# Patient Record
Sex: Female | Born: 1990 | Race: White | Hispanic: No | Smoking: Current every day smoker
Health system: Southern US, Community
[De-identification: ages and names within clinical notes are randomized; demographics above are authoritative.]

## PROBLEM LIST (undated history)

## (undated) DIAGNOSIS — O24419 Gestational diabetes mellitus in pregnancy, unspecified control: Secondary | ICD-10-CM

---

## 2017-06-12 ENCOUNTER — Encounter (HOSPITAL_COMMUNITY): Payer: Self-pay | Admitting: Emergency Medicine

## 2017-06-12 ENCOUNTER — Emergency Department (HOSPITAL_COMMUNITY)
Admission: EM | Admit: 2017-06-12 | Discharge: 2017-06-12 | Disposition: A | Discharge status: Home / Self Care | Payer: Medicaid Other | Attending: Emergency Medicine | Admitting: Emergency Medicine

## 2017-06-12 ENCOUNTER — Emergency Department (HOSPITAL_COMMUNITY): Payer: Medicaid Other

## 2017-06-12 DIAGNOSIS — O99331 Smoking (tobacco) complicating pregnancy, first trimester: Secondary | ICD-10-CM | POA: Diagnosis not present

## 2017-06-12 DIAGNOSIS — R05 Cough: Secondary | ICD-10-CM | POA: Insufficient documentation

## 2017-06-12 DIAGNOSIS — F17210 Nicotine dependence, cigarettes, uncomplicated: Secondary | ICD-10-CM | POA: Insufficient documentation

## 2017-06-12 DIAGNOSIS — O9952 Diseases of the respiratory system complicating childbirth: Secondary | ICD-10-CM | POA: Diagnosis not present

## 2017-06-12 DIAGNOSIS — R059 Cough, unspecified: Secondary | ICD-10-CM

## 2017-06-12 DIAGNOSIS — Z3A01 Less than 8 weeks gestation of pregnancy: Secondary | ICD-10-CM

## 2017-06-12 DIAGNOSIS — O219 Vomiting of pregnancy, unspecified: Secondary | ICD-10-CM | POA: Diagnosis not present

## 2017-06-12 DIAGNOSIS — R0602 Shortness of breath: Secondary | ICD-10-CM | POA: Insufficient documentation

## 2017-06-12 DIAGNOSIS — J40 Bronchitis, not specified as acute or chronic: Secondary | ICD-10-CM | POA: Insufficient documentation

## 2017-06-12 DIAGNOSIS — O9989 Other specified diseases and conditions complicating pregnancy, childbirth and the puerperium: Secondary | ICD-10-CM | POA: Diagnosis present

## 2017-06-12 LAB — CBC
HCT: 40.2 % (ref 36.0–46.0)
Hemoglobin: 13.2 g/dL (ref 12.0–15.0)
MCH: 28.5 pg (ref 26.0–34.0)
MCHC: 32.8 g/dL (ref 30.0–36.0)
MCV: 86.8 fL (ref 78.0–100.0)
PLATELETS: 305 10*3/uL (ref 150–400)
RBC: 4.63 MIL/uL (ref 3.87–5.11)
RDW: 14.8 % (ref 11.5–15.5)
WBC: 18.3 10*3/uL — AB (ref 4.0–10.5)

## 2017-06-12 LAB — BASIC METABOLIC PANEL
Anion gap: 6 (ref 5–15)
BUN: 11 mg/dL (ref 6–20)
CALCIUM: 8.8 mg/dL — AB (ref 8.9–10.3)
CO2: 22 mmol/L (ref 22–32)
Chloride: 107 mmol/L (ref 101–111)
Creatinine, Ser: 0.56 mg/dL (ref 0.44–1.00)
GFR calc Af Amer: >60 mL/min (ref >60)
GLUCOSE: 118 mg/dL — AB (ref 65–99)
Potassium: 3.6 mmol/L (ref 3.5–5.1)
SODIUM: 135 mmol/L (ref 135–145)

## 2017-06-12 LAB — I-STAT BETA HCG BLOOD, ED (MC, WL, AP ONLY): HCG, QUANTITATIVE: 274.6 m[IU]/mL — AB (ref <5)

## 2017-06-12 MED ORDER — HYDROCOD POLST-CPM POLST ER 10-8 MG/5ML PO SUER: 5.0000 mL | Freq: Two times a day (BID) | ORAL | 0 refills | Status: DC | PRN

## 2017-06-12 MED ORDER — ALBUTEROL SULFATE (2.5 MG/3ML) 0.083% IN NEBU
5.0000 mg | INHALATION_SOLUTION | Freq: Once | RESPIRATORY_TRACT | Status: AC
  Administered 2017-06-12: 5 mg via RESPIRATORY_TRACT
  Filled 2017-06-12: qty 6

## 2017-06-12 MED ORDER — IPRATROPIUM BROMIDE 0.02 % IN SOLN
0.5000 mg | Freq: Once | RESPIRATORY_TRACT | Status: AC
  Administered 2017-06-12: 0.5 mg via RESPIRATORY_TRACT
  Filled 2017-06-12: qty 2.5

## 2017-06-12 MED ORDER — ALBUTEROL SULFATE (2.5 MG/3ML) 0.083% IN NEBU
INHALATION_SOLUTION | RESPIRATORY_TRACT | Status: AC
  Filled 2017-06-12: qty 6

## 2017-06-12 MED ORDER — ALBUTEROL SULFATE (2.5 MG/3ML) 0.083% IN NEBU
5.0000 mg | INHALATION_SOLUTION | Freq: Once | RESPIRATORY_TRACT | Status: AC
  Administered 2017-06-12: 5 mg via RESPIRATORY_TRACT

## 2017-06-12 NOTE — Discharge Instructions (Signed)
Stop taking Hycodan cough elixir and start Tussionex as directed. Continue your other medications. Follow up with your doctor for recheck in 2 days. Return here with any worsening symptoms or new concerns.

## 2017-06-12 NOTE — ED Provider Notes (Signed)
[facility] DEPT Provider Note   CSN: [id number] Arrival date & time: 06/12/17  0008     History   Chief Complaint Chief Complaint  Patient presents with  . Cough  . Shortness of Breath    HPI Jenna Silva is a 26 y.o. female.  Patient with cough, chills without fever, post-tussive vomiting and chest discomfort for the past week. She was seen at [facility] ED 3 days ago (06/09/17) and diagnosed with bronchitis. She has been taking steroids, zithromax, hycodan, and an albuterol inhaler since she was seen there but feels she is getting worse, prompting a return to the emergency department. She reports positive pregnancy tests at home. No vaginal bleeding, discharge.    The history is provided by the patient. No language interpreter was used.  Cough  Associated symptoms include chills and shortness of breath. Pertinent negatives include no chest pain, no headaches, no rhinorrhea, no sore throat and no myalgias.  Shortness of Breath  Associated symptoms include cough and vomiting. Pertinent negatives include no fever, no headaches, no rhinorrhea, no sore throat, no chest pain and no abdominal pain.    History reviewed. No pertinent past medical history.  There are no active problems to display for this patient.   Past Surgical History:  Procedure Laterality Date  . CESAREAN SECTION  2011    OB History    No data available       Home Medications    Prior to Admission medications   Not on File    Family History No family history on file.  Social History Social History  Substance Use Topics  . Smoking status: Current Every Day Smoker    Packs/day: 0.50    Types: Cigarettes  . Smokeless tobacco: Never Used  . Alcohol use No     Allergies   Patient has no known allergies.   Review of Systems Review of Systems  Constitutional: Positive for chills. Negative for fever.  HENT: Negative.  Negative for congestion, rhinorrhea, sinus pressure and  sore throat.   Respiratory: Positive for cough, chest tightness and shortness of breath.   Cardiovascular: Negative.  Negative for chest pain.  Gastrointestinal: Positive for vomiting. Negative for abdominal pain and nausea.  Musculoskeletal: Negative.  Negative for myalgias.  Skin: Negative.   Neurological: Negative.  Negative for weakness, light-headedness and headaches.     Physical Exam Updated Vital Signs BP 129/75 (BP Location: Right Arm)   Pulse 96   Temp 98.1 F (36.7 C) (Oral)   Resp (!) 22   LMP 05/14/2017 (Exact Date)   SpO2 94%   Physical Exam  Constitutional: She is oriented to person, place, and time. She appears well-developed and well-nourished. No distress.  HENT:  Head: Normocephalic.  Mouth/Throat: Oropharynx is clear and moist.  Neck: Normal range of motion. Neck supple.  Cardiovascular: Normal rate and regular rhythm.   No murmur heard. Pulmonary/Chest: Effort normal. She has no wheezes.  Actively coughing. Full air movement to all fields.  Abdominal: Soft. Bowel sounds are normal. There is no tenderness. There is no rebound and no guarding.  Musculoskeletal: Normal range of motion. She exhibits no edema.  Neurological: She is alert and oriented to person, place, and time.  Skin: Skin is warm and dry. No rash noted.  Psychiatric: She has a normal mood and affect.     ED Treatments / Results  Labs (all labs ordered are listed, but only abnormal results are displayed) Labs Reviewed  CBC - Abnormal;  Notable for the following:       Result Value   WBC 18.3 (*)    All other components within normal limits  BASIC METABOLIC PANEL - Abnormal; Notable for the following:    Glucose, Bld 118 (*)    Calcium 8.8 (*)    All other components within normal limits  I-STAT BETA HCG BLOOD, ED ([facility], [facility], [facility] ONLY) - Abnormal; Notable for the following:    I-stat hCG, quantitative 274.6 (*)    All other components within normal limits   Results for orders placed or  performed during the hospital encounter of 06/12/17  CBC  Result Value Ref Range   WBC 18.3 (H) 4.0 - 10.5 K/uL   RBC 4.63 3.87 - 5.11 MIL/uL   Hemoglobin 13.2 12.0 - 15.0 g/dL   HCT 16.1 09.6 - 04.5 %   MCV 86.8 78.0 - 100.0 fL   MCH 28.5 26.0 - 34.0 pg   MCHC 32.8 30.0 - 36.0 g/dL   RDW 40.9 81.1 - 91.4 %   Platelets 305 150 - 400 K/uL  Basic metabolic panel  Result Value Ref Range   Sodium 135 135 - 145 mmol/L   Potassium 3.6 3.5 - 5.1 mmol/L   Chloride 107 101 - 111 mmol/L   CO2 22 22 - 32 mmol/L   Glucose, Bld 118 (H) 65 - 99 mg/dL   BUN 11 6 - 20 mg/dL   Creatinine, Ser 7.82 0.44 - 1.00 mg/dL   Calcium 8.8 (L) 8.9 - 10.3 mg/dL   GFR calc non Af Amer >60 >60 mL/min   GFR calc Af Amer >60 >60 mL/min   Anion gap 6 5 - 15  I-Stat Beta hCG blood, ED ([facility], [facility], [facility] only)  Result Value Ref Range   I-stat hCG, quantitative 274.6 (H) <5 mIU/mL   Comment 3            EKG  EKG Interpretation None       Radiology Dg Chest 2 View  Result Date: 06/12/2017 CLINICAL DATA:  Shortness of breath and wheezing 2 days with cough. EXAM: CHEST  2 VIEW COMPARISON:  01/18/2015 FINDINGS: Lungs are adequately inflated without focal consolidation or effusion. There is minimal prominence of the central bronchovascular markings. Cardiomediastinal silhouette and remainder the exam is unchanged. IMPRESSION: Minimal prominence of the central bronchovascular markings which may be due to an acute bronchitic process. Electronically Signed   By: Elberta Fortis M.D.   On: 06/12/2017 01:18    Procedures Procedures (including critical care time)  Medications Ordered in ED Medications  albuterol (PROVENTIL) (2.5 MG/3ML) 0.083% nebulizer solution (not administered)  albuterol (PROVENTIL) (2.5 MG/3ML) 0.083% nebulizer solution 5 mg (5 mg Nebulization Given 06/12/17 0033)     Initial Impression / Assessment and Plan / ED Course  I have reviewed the triage vital signs and the nursing notes.  Pertinent  labs & imaging results that were available during my care of the patient were reviewed by me and considered in my medical decision making (see chart for details).     Patient presents for evaluation of cough. She is being treated for bronchitis with steroids, zithromax, hycodan and albuterol and reports her cough is getting worse. No fever. She denies any tobacco use in 3 days.   CXR clear. She is actively coughing throughout exam. Nebulizer provided with some relief. Nonproductive cough. No wheezing on exam. Full air movement.   Discussed cough control with NP at [facility]. Patient is very  early pregnancy. Advised Tussionex can be safely used. Will provide Rx tussionex. Recommended PCP follow up this week.   Final Clinical Impressions(s) / ED Diagnoses   Final diagnoses:  None   1. Bronchitis 2. Cough  New Prescriptions New Prescriptions   No medications on file     Elpidio Anis, Cordelia Poche 06/12/17 [id number]    Glynn Octave, MD 06/12/17 [phone]

## 2017-06-12 NOTE — ED Triage Notes (Signed)
Pt reports wheezing/SHOB since Friday, seen at [facility] and dxed with bronchitis. States taking zpack, methylprednisolone, albuterol inhaler and hydrocodone cough syrup, states still not feeling better. Also reporting three positive pregnancy tests this week.

## 2017-06-12 NOTE — ED Notes (Signed)
PT states understanding of care given, follow up care, and medication prescribed. PT ambulated from ED to car with a steady gait. 

## 2017-06-25 ENCOUNTER — Encounter (HOSPITAL_COMMUNITY): Payer: Self-pay | Admitting: Emergency Medicine

## 2017-06-25 DIAGNOSIS — Z3A01 Less than 8 weeks gestation of pregnancy: Secondary | ICD-10-CM | POA: Diagnosis not present

## 2017-06-25 DIAGNOSIS — O9989 Other specified diseases and conditions complicating pregnancy, childbirth and the puerperium: Secondary | ICD-10-CM | POA: Diagnosis not present

## 2017-06-25 DIAGNOSIS — O99331 Smoking (tobacco) complicating pregnancy, first trimester: Secondary | ICD-10-CM | POA: Insufficient documentation

## 2017-06-25 DIAGNOSIS — R102 Pelvic and perineal pain: Secondary | ICD-10-CM | POA: Insufficient documentation

## 2017-06-25 DIAGNOSIS — F17210 Nicotine dependence, cigarettes, uncomplicated: Secondary | ICD-10-CM | POA: Diagnosis not present

## 2017-06-25 DIAGNOSIS — R1032 Left lower quadrant pain: Secondary | ICD-10-CM | POA: Diagnosis not present

## 2017-06-25 DIAGNOSIS — R112 Nausea with vomiting, unspecified: Secondary | ICD-10-CM | POA: Insufficient documentation

## 2017-06-25 LAB — COMPREHENSIVE METABOLIC PANEL
ALBUMIN: 3.4 g/dL — AB (ref 3.5–5.0)
ALT: 27 U/L (ref 14–54)
AST: 19 U/L (ref 15–41)
Alkaline Phosphatase: 71 U/L (ref 38–126)
Anion gap: 8 (ref 5–15)
BUN: 8 mg/dL (ref 6–20)
CHLORIDE: 104 mmol/L (ref 101–111)
CO2: 19 mmol/L — ABNORMAL LOW (ref 22–32)
Calcium: 8.7 mg/dL — ABNORMAL LOW (ref 8.9–10.3)
Creatinine, Ser: 0.64 mg/dL (ref 0.44–1.00)
GFR calc Af Amer: >60 mL/min (ref >60)
GFR calc non Af Amer: >60 mL/min (ref >60)
GLUCOSE: 117 mg/dL — AB (ref 65–99)
POTASSIUM: 3.7 mmol/L (ref 3.5–5.1)
SODIUM: 131 mmol/L — AB (ref 135–145)
Total Bilirubin: 0.5 mg/dL (ref 0.3–1.2)
Total Protein: 6.3 g/dL — ABNORMAL LOW (ref 6.5–8.1)

## 2017-06-25 LAB — URINALYSIS, ROUTINE W REFLEX MICROSCOPIC
Bilirubin Urine: NEGATIVE
GLUCOSE, UA: NEGATIVE mg/dL
Hgb urine dipstick: NEGATIVE
KETONES UR: NEGATIVE mg/dL
LEUKOCYTES UA: NEGATIVE
Nitrite: NEGATIVE
PH: 5 (ref 5.0–8.0)
Protein, ur: NEGATIVE mg/dL
Specific Gravity, Urine: 1.025 (ref 1.005–1.030)

## 2017-06-25 LAB — CBC
HEMATOCRIT: 39.9 % (ref 36.0–46.0)
Hemoglobin: 13.3 g/dL (ref 12.0–15.0)
MCH: 28.7 pg (ref 26.0–34.0)
MCHC: 33.3 g/dL (ref 30.0–36.0)
MCV: 86 fL (ref 78.0–100.0)
Platelets: 260 10*3/uL (ref 150–400)
RBC: 4.64 MIL/uL (ref 3.87–5.11)
RDW: 14.9 % (ref 11.5–15.5)
WBC: 14.1 10*3/uL — AB (ref 4.0–10.5)

## 2017-06-25 LAB — LIPASE, BLOOD: LIPASE: 30 U/L (ref 11–51)

## 2017-06-25 LAB — HCG, QUANTITATIVE, PREGNANCY: hCG, Beta Chain, Quant, S: 9630 m[IU]/mL — ABNORMAL HIGH (ref <5)

## 2017-06-25 NOTE — ED Triage Notes (Signed)
Pt reports abdominal cramping and some dizziness for the past week. Pt reports pain ususally decreases w/ bowel movement however not for he past three days even though she has had regular bowel movements.  Pt is [redacted] weeks pregnant.

## 2017-06-26 ENCOUNTER — Emergency Department (HOSPITAL_COMMUNITY): Payer: Medicaid Other

## 2017-06-26 ENCOUNTER — Emergency Department (HOSPITAL_COMMUNITY)
Admission: EM | Admit: 2017-06-26 | Discharge: 2017-06-26 | Disposition: A | Discharge status: Home / Self Care | Payer: Medicaid Other | Attending: Emergency Medicine | Admitting: Emergency Medicine

## 2017-06-26 DIAGNOSIS — R109 Unspecified abdominal pain: Secondary | ICD-10-CM

## 2017-06-26 DIAGNOSIS — O219 Vomiting of pregnancy, unspecified: Secondary | ICD-10-CM

## 2017-06-26 DIAGNOSIS — O26891 Other specified pregnancy related conditions, first trimester: Secondary | ICD-10-CM

## 2017-06-26 LAB — WET PREP, GENITAL
Sperm: NONE SEEN
Trich, Wet Prep: NONE SEEN
Yeast Wet Prep HPF POC: NONE SEEN

## 2017-06-26 LAB — ABO/RH: ABO/RH(D): O POS

## 2017-06-26 LAB — GC/CHLAMYDIA PROBE AMP (~~LOC~~) NOT AT ARMC
Chlamydia: NEGATIVE
NEISSERIA GONORRHEA: NEGATIVE

## 2017-06-26 MED ORDER — DOXYLAMINE-PYRIDOXINE 10-10 MG PO TBEC: 2.0000 | DELAYED_RELEASE_TABLET | Freq: Every day | ORAL | 2 refills | Status: AC

## 2017-06-26 NOTE — ED Notes (Signed)
Pt departed in NAD, refused use of wheelchair.  

## 2017-06-26 NOTE — ED Provider Notes (Signed)
[facility] Provider Note   CSN: [id number] Arrival date & time: 06/25/17  2209     History   Chief Complaint Chief Complaint  Patient presents with  . Abdominal Cramping  . Dizziness    HPI Jenna Silva is a 26 y.o. female.  Patient presents to the emergency department for evaluation of abdominal and pelvic pain. Patient reports that she has been having symptoms for approximately a week. She reports intermittent left sided stabbing pains. Pains are predominantly in the left upper abdomen area, but at times her left lower abdomen and radiate up to the upper abdomen. She has had nausea and vomiting associated with early pregnancy, no change in these symptoms. She has not had diarrhea or constipation.      History reviewed. No pertinent past medical history.  There are no active problems to display for this patient.   Past Surgical History:  Procedure Laterality Date  . CESAREAN SECTION  2011    OB History    No data available       Home Medications    Prior to Admission medications   Medication Sig Start Date End Date Taking? Authorizing Provider  Prenatal Vit-Fe Fumarate-FA (PRENATAL MULTIVITAMIN) TABS tablet Take 1 tablet by mouth daily at 12 noon.   Yes [provider]  Doxylamine-Pyridoxine 10-10 MG TBEC Take 2 tablets by mouth at bedtime. 06/26/17   Gilda Crease, MD    Family History No family history on file.  Social History Social History  Substance Use Topics  . Smoking status: Current Every Day Smoker    Packs/day: 0.50    Types: Cigarettes  . Smokeless tobacco: Never Used  . Alcohol use No     Allergies   Patient has no known allergies.   Review of Systems Review of Systems  Gastrointestinal: Positive for abdominal pain.  Genitourinary: Positive for pelvic pain and vaginal discharge (white and creamy). Negative for vaginal bleeding.  All other systems reviewed and are negative.    Physical  Exam Updated Vital Signs BP 111/62   Pulse 98   Temp 97.8 F (36.6 C) (Oral)   Resp 18   Ht  (1.626 m)   Wt 117.9 kg (260 lb)   SpO2 99%   BMI 44.63 kg/m   Physical Exam  Constitutional: She is oriented to person, place, and time. She appears well-developed and well-nourished. No distress.  HENT:  Head: Normocephalic and atraumatic.  Right Ear: Hearing normal.  Left Ear: Hearing normal.  Nose: Nose normal.  Mouth/Throat: Oropharynx is clear and moist and mucous membranes are normal.  Eyes: Pupils are equal, round, and reactive to light. Conjunctivae and EOM are normal.  Neck: Normal range of motion. Neck supple.  Cardiovascular: Regular rhythm, S1 normal and S2 normal.  Exam reveals no gallop and no friction rub.   No murmur heard. Pulmonary/Chest: Effort normal and breath sounds normal. No respiratory distress. She exhibits no tenderness.  Abdominal: Soft. Normal appearance and bowel sounds are normal. There is no hepatosplenomegaly. There is tenderness in the left upper quadrant and left lower quadrant. There is no rebound, no guarding, no tenderness at McBurney's point and negative Murphy's sign. No hernia.  Musculoskeletal: Normal range of motion.  Neurological: She is alert and oriented to person, place, and time. She has normal strength. No cranial nerve deficit or sensory deficit. Coordination normal. GCS eye subscore is 4. GCS verbal subscore is 5. GCS motor subscore is 6.  Skin: Skin is warm,  dry and intact. No rash noted. No cyanosis.  Psychiatric: She has a normal mood and affect. Her speech is normal and behavior is normal. Thought content normal.  Nursing note and vitals reviewed.    ED Treatments / Results  Labs (all labs ordered are listed, but only abnormal results are displayed) Labs Reviewed  COMPREHENSIVE METABOLIC PANEL - Abnormal; Notable for the following:       Result Value   Sodium 131 (*)    CO2 19 (*)    Glucose, Bld 117 (*)    Calcium 8.7  (*)    Total Protein 6.3 (*)    Albumin 3.4 (*)    All other components within normal limits  CBC - Abnormal; Notable for the following:    WBC 14.1 (*)    All other components within normal limits  URINALYSIS, ROUTINE W REFLEX MICROSCOPIC - Abnormal; Notable for the following:    APPearance HAZY (*)    All other components within normal limits  HCG, QUANTITATIVE, PREGNANCY - Abnormal; Notable for the following:    hCG, Beta Chain, Quant, S 9,630 (*)    All other components within normal limits  WET PREP, GENITAL  LIPASE, BLOOD  ABO/RH  GC/CHLAMYDIA PROBE AMP ([place]) NOT AT [facility]    EKG  EKG Interpretation None       Radiology US Ob Comp Less 14 Wks  Result Date: 06/26/2017 CLINICAL DATA:  Initial evaluation for acute pelvic pain and early pregnancy EXAM: OBSTETRIC <14 WK [place] AND TRANSVAGINAL OB US TECHNIQUE: Both transabdominal and transvaginal ultrasound examinations were performed for complete evaluation of the gestation as well as the maternal uterus, adnexal regions, and pelvic cul-de-sac. Transvaginal technique was performed to assess early pregnancy. COMPARISON:  None. FINDINGS: Intrauterine gestational sac: Single Yolk sac:  Present Embryo:  Not visualized. Cardiac Activity: N/A Heart Rate: N/A  bpm MSD: 11.2  mm   5 w   6  d Subchorionic hemorrhage:  None visualized. Maternal uterus/adnexae: Ovaries normal in appearance bilaterally. No adnexal mass. Trace free fluid within the pelvis. IMPRESSION: 1. Single intrauterine gestational sac with internal yolk sac, but no embryo yet visualized. Estimated gestational age 372 weeks and 6 days by mean sac diameter. Recommend follow-up quantitative B-HCG levels and follow-up US in 14 days to assess viability. This recommendation follows SRU consensus guidelines: Diagnostic Criteria for Nonviable Pregnancy Early in the First Trimester. Malva Limes Med 2013; 782:9562-13. 2. No other acute maternal uterine or adnexal abnormality identified.  Electronically Signed   By: Rise Mu M.D.   On: 06/26/2017 01:56   US Ob Transvaginal  Result Date: 06/26/2017 CLINICAL DATA:  Initial evaluation for acute pelvic pain and early pregnancy EXAM: OBSTETRIC <14 WK [place] AND TRANSVAGINAL OB US TECHNIQUE: Both transabdominal and transvaginal ultrasound examinations were performed for complete evaluation of the gestation as well as the maternal uterus, adnexal regions, and pelvic cul-de-sac. Transvaginal technique was performed to assess early pregnancy. COMPARISON:  None. FINDINGS: Intrauterine gestational sac: Single Yolk sac:  Present Embryo:  Not visualized. Cardiac Activity: N/A Heart Rate: N/A  bpm MSD: 11.2  mm   5 w   6  d Subchorionic hemorrhage:  None visualized. Maternal uterus/adnexae: Ovaries normal in appearance bilaterally. No adnexal mass. Trace free fluid within the pelvis. IMPRESSION: 1. Single intrauterine gestational sac with internal yolk sac, but no embryo yet visualized. Estimated gestational age 372 weeks and 6 days by mean sac diameter. Recommend follow-up quantitative B-HCG levels and follow-up  US in 14 days to assess viability. This recommendation follows SRU consensus guidelines: Diagnostic Criteria for Nonviable Pregnancy Early in the First Trimester. Malva Limes Med 2013; 010:2725-36. 2. No other acute maternal uterine or adnexal abnormality identified. Electronically Signed   By: Rise Mu M.D.   On: 06/26/2017 01:56    Procedures Procedures (including critical care time)  Medications Ordered in ED Medications - No data to display   Initial Impression / Assessment and Plan / ED Course  I have reviewed the triage vital signs and the nursing notes.  Pertinent labs & imaging results that were available during my care of the patient were reviewed by me and considered in my medical decision making (see chart for details).     Patient presents to the emergency department with complaints of left-sided abdominal  and pelvic pain. Symptoms have been ongoing for a week. She reports that she thinks she might have pulled a muscle, but is concerned because she is a proximally [redacted] weeks pregnant and has had a miscarriage in the past. She has not had any vaginal bleeding. Exam revealed predominantly left upper quadrant tenderness, did have left lower tenderness as well. No guarding or rebound. Blood work unremarkable other than leukocytosis, unclear significance. No right-sided tenderness to suggest gallbladder disease or appendicitis.Ultrasound shows intrauterine gestational sac 5 weeks 6 days, no embryo noted yet. This was conveyed to the patient, she has follow-up scheduled with OB/GYN in 2 weeks which is appropriate. Treat pain with Tylenol. Diclegis for n/v.  Final Clinical Impressions(s) / ED Diagnoses   Final diagnoses:  Abdominal pain during pregnancy in first trimester  Nausea and vomiting in pregnancy    New Prescriptions New Prescriptions   DOXYLAMINE-PYRIDOXINE 10-10 MG TBEC    Take 2 tablets by mouth at bedtime.     Gilda Crease, MD 06/26/17 [phone]

## 2018-05-04 ENCOUNTER — Other Ambulatory Visit (HOSPITAL_COMMUNITY): Payer: Self-pay | Admitting: Specialist

## 2018-05-04 DIAGNOSIS — Z363 Encounter for antenatal screening for malformations: Secondary | ICD-10-CM

## 2018-05-04 DIAGNOSIS — Z3A33 33 weeks gestation of pregnancy: Secondary | ICD-10-CM

## 2018-05-04 DIAGNOSIS — O365931 Maternal care for other known or suspected poor fetal growth, third trimester, fetus 1: Secondary | ICD-10-CM

## 2018-05-08 ENCOUNTER — Other Ambulatory Visit (HOSPITAL_COMMUNITY): Payer: Self-pay | Admitting: Specialist

## 2018-05-08 ENCOUNTER — Ambulatory Visit (HOSPITAL_COMMUNITY)
Admission: RE | Admit: 2018-05-08 | Discharge: 2018-05-08 | Disposition: A | Discharge status: Home / Self Care | Payer: Medicaid Other | Source: Ambulatory Visit | Attending: Specialist | Admitting: Specialist

## 2018-05-08 ENCOUNTER — Encounter (HOSPITAL_COMMUNITY): Payer: Self-pay | Admitting: *Deleted

## 2018-05-08 DIAGNOSIS — Z363 Encounter for antenatal screening for malformations: Secondary | ICD-10-CM

## 2018-05-08 DIAGNOSIS — O24414 Gestational diabetes mellitus in pregnancy, insulin controlled: Secondary | ICD-10-CM | POA: Insufficient documentation

## 2018-05-08 DIAGNOSIS — O34219 Maternal care for unspecified type scar from previous cesarean delivery: Secondary | ICD-10-CM | POA: Insufficient documentation

## 2018-05-08 DIAGNOSIS — F17200 Nicotine dependence, unspecified, uncomplicated: Secondary | ICD-10-CM | POA: Diagnosis not present

## 2018-05-08 DIAGNOSIS — O99333 Smoking (tobacco) complicating pregnancy, third trimester: Secondary | ICD-10-CM | POA: Diagnosis not present

## 2018-05-08 DIAGNOSIS — O365930 Maternal care for other known or suspected poor fetal growth, third trimester, not applicable or unspecified: Secondary | ICD-10-CM | POA: Insufficient documentation

## 2018-05-08 DIAGNOSIS — O10013 Pre-existing essential hypertension complicating pregnancy, third trimester: Secondary | ICD-10-CM | POA: Diagnosis not present

## 2018-05-08 DIAGNOSIS — O99213 Obesity complicating pregnancy, third trimester: Secondary | ICD-10-CM | POA: Diagnosis not present

## 2018-05-08 DIAGNOSIS — O24415 Gestational diabetes mellitus in pregnancy, controlled by oral hypoglycemic drugs: Secondary | ICD-10-CM | POA: Diagnosis not present

## 2018-05-08 DIAGNOSIS — O365931 Maternal care for other known or suspected poor fetal growth, third trimester, fetus 1: Secondary | ICD-10-CM | POA: Diagnosis not present

## 2018-05-08 DIAGNOSIS — O09213 Supervision of pregnancy with history of pre-term labor, third trimester: Secondary | ICD-10-CM | POA: Diagnosis not present

## 2018-05-08 DIAGNOSIS — Z3A33 33 weeks gestation of pregnancy: Secondary | ICD-10-CM | POA: Diagnosis not present

## 2018-05-08 HISTORY — DX: Gestational diabetes mellitus in pregnancy, unspecified control: O24.419

## 2018-05-21 ENCOUNTER — Other Ambulatory Visit (HOSPITAL_COMMUNITY): Payer: Self-pay | Admitting: Specialist

## 2018-05-21 DIAGNOSIS — Z3689 Encounter for other specified antenatal screening: Secondary | ICD-10-CM

## 2018-05-21 DIAGNOSIS — Z3A35 35 weeks gestation of pregnancy: Secondary | ICD-10-CM

## 2018-05-21 DIAGNOSIS — O4103X0 Oligohydramnios, third trimester, not applicable or unspecified: Secondary | ICD-10-CM

## 2018-05-24 ENCOUNTER — Other Ambulatory Visit (HOSPITAL_COMMUNITY): Payer: Self-pay | Admitting: Specialist

## 2018-05-24 DIAGNOSIS — Z3A35 35 weeks gestation of pregnancy: Secondary | ICD-10-CM

## 2018-05-24 DIAGNOSIS — O4103X0 Oligohydramnios, third trimester, not applicable or unspecified: Secondary | ICD-10-CM

## 2018-05-24 DIAGNOSIS — Z362 Encounter for other antenatal screening follow-up: Secondary | ICD-10-CM

## 2018-05-31 ENCOUNTER — Ambulatory Visit (HOSPITAL_COMMUNITY)
Admission: RE | Admit: 2018-05-31 | Discharge: 2018-05-31 | Disposition: A | Discharge status: Home / Self Care | Payer: Medicaid Other | Source: Ambulatory Visit | Attending: Specialist | Admitting: Specialist

## 2018-05-31 ENCOUNTER — Encounter (HOSPITAL_COMMUNITY): Payer: Self-pay

## 2018-05-31 DIAGNOSIS — O34219 Maternal care for unspecified type scar from previous cesarean delivery: Secondary | ICD-10-CM

## 2018-05-31 DIAGNOSIS — O10013 Pre-existing essential hypertension complicating pregnancy, third trimester: Secondary | ICD-10-CM

## 2018-05-31 DIAGNOSIS — O365930 Maternal care for other known or suspected poor fetal growth, third trimester, not applicable or unspecified: Secondary | ICD-10-CM | POA: Diagnosis not present

## 2018-05-31 DIAGNOSIS — Z3A35 35 weeks gestation of pregnancy: Secondary | ICD-10-CM

## 2018-05-31 DIAGNOSIS — Z362 Encounter for other antenatal screening follow-up: Secondary | ICD-10-CM

## 2018-05-31 DIAGNOSIS — O4103X0 Oligohydramnios, third trimester, not applicable or unspecified: Secondary | ICD-10-CM | POA: Diagnosis not present

## 2018-05-31 DIAGNOSIS — O99333 Smoking (tobacco) complicating pregnancy, third trimester: Secondary | ICD-10-CM

## 2018-05-31 DIAGNOSIS — O99213 Obesity complicating pregnancy, third trimester: Secondary | ICD-10-CM | POA: Diagnosis not present

## 2018-05-31 DIAGNOSIS — O24414 Gestational diabetes mellitus in pregnancy, insulin controlled: Secondary | ICD-10-CM

## 2018-05-31 DIAGNOSIS — Z3A36 36 weeks gestation of pregnancy: Secondary | ICD-10-CM | POA: Diagnosis not present

## 2018-09-18 ENCOUNTER — Encounter (HOSPITAL_COMMUNITY): Payer: Self-pay

## 2019-03-14 IMAGING — DX DG CHEST 2V
2 series · 2 of 2 positions shown · non-contrast
Comparison: 01/18/2015

CLINICAL DATA: Shortness of breath and wheezing 2 days with cough.

EXAM:
CHEST  2 VIEW

[chest pa]
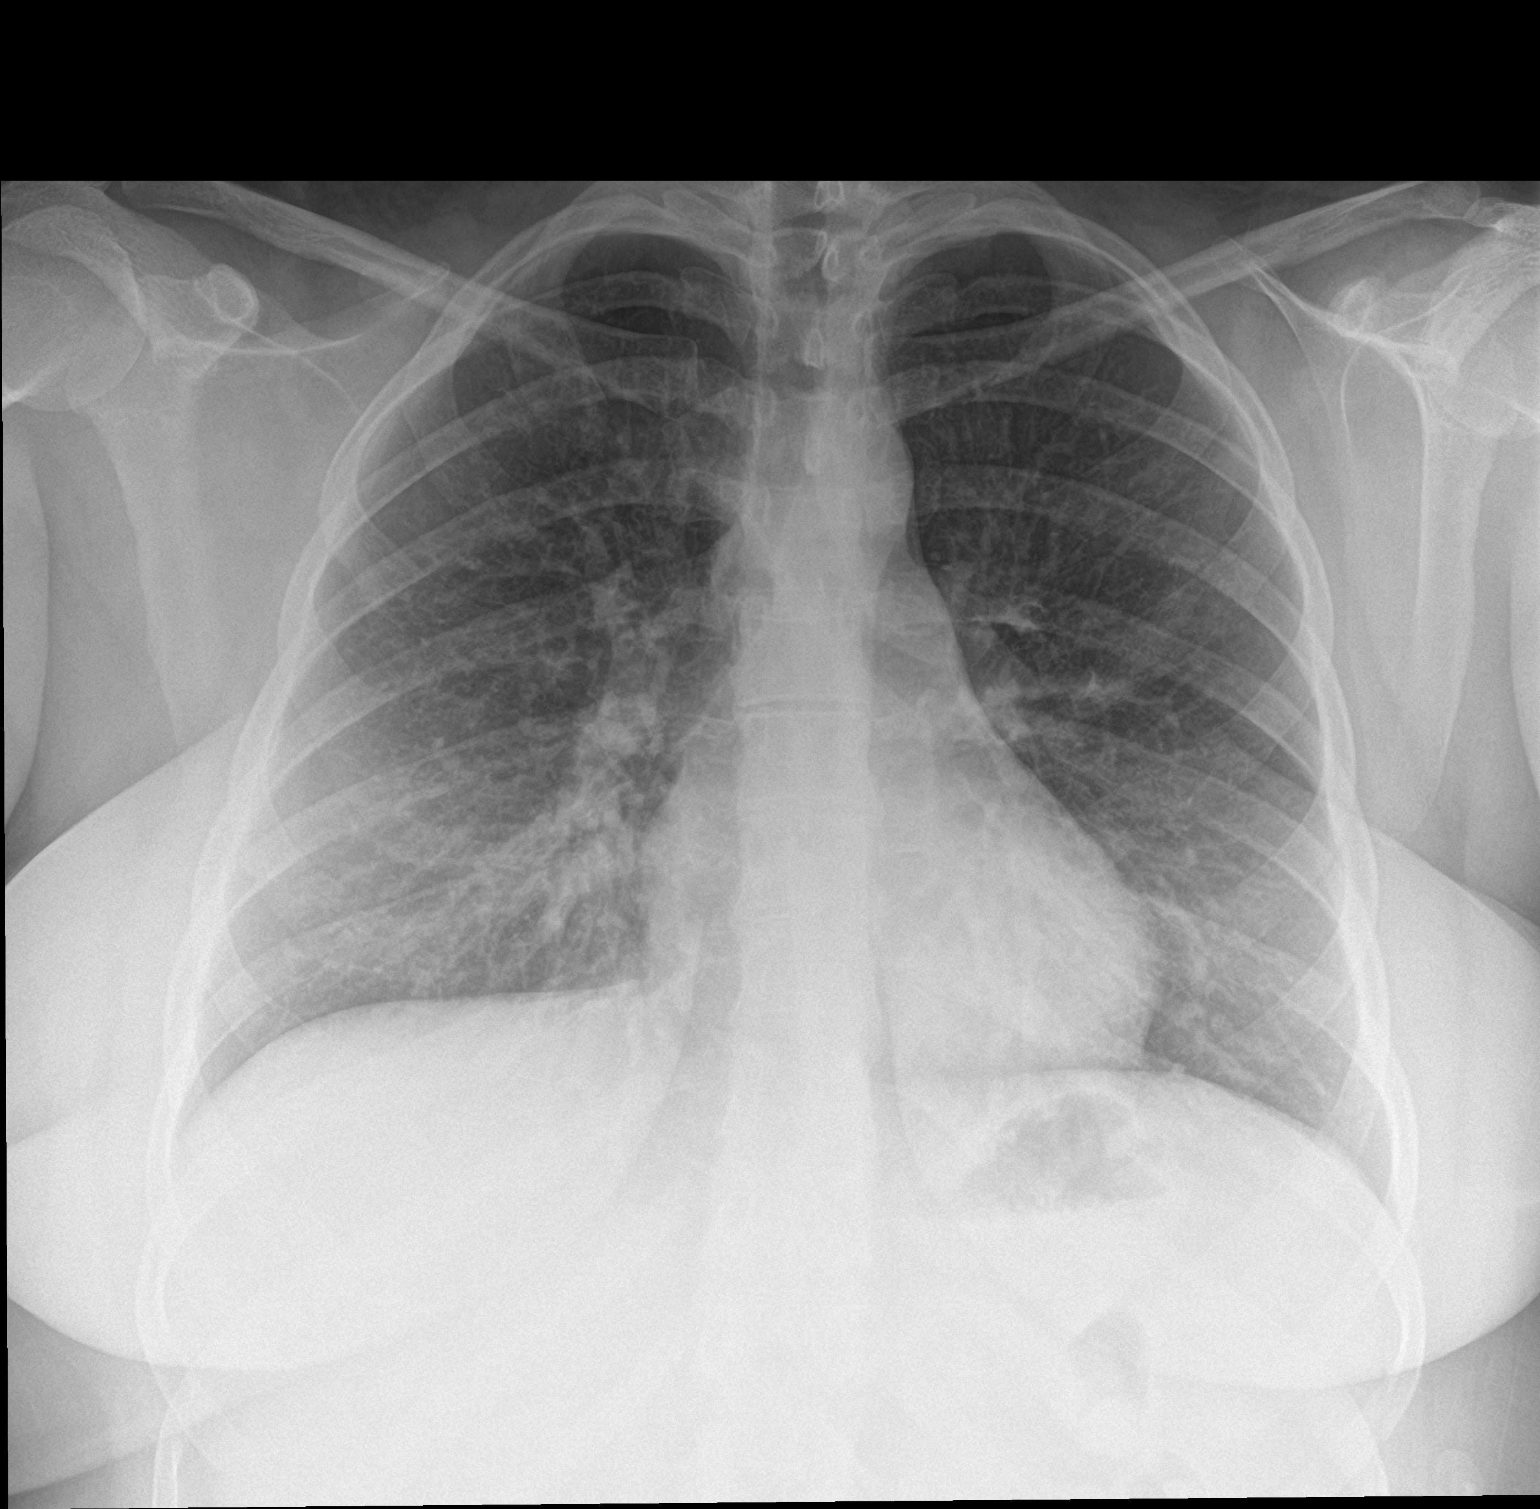

[chest lat]
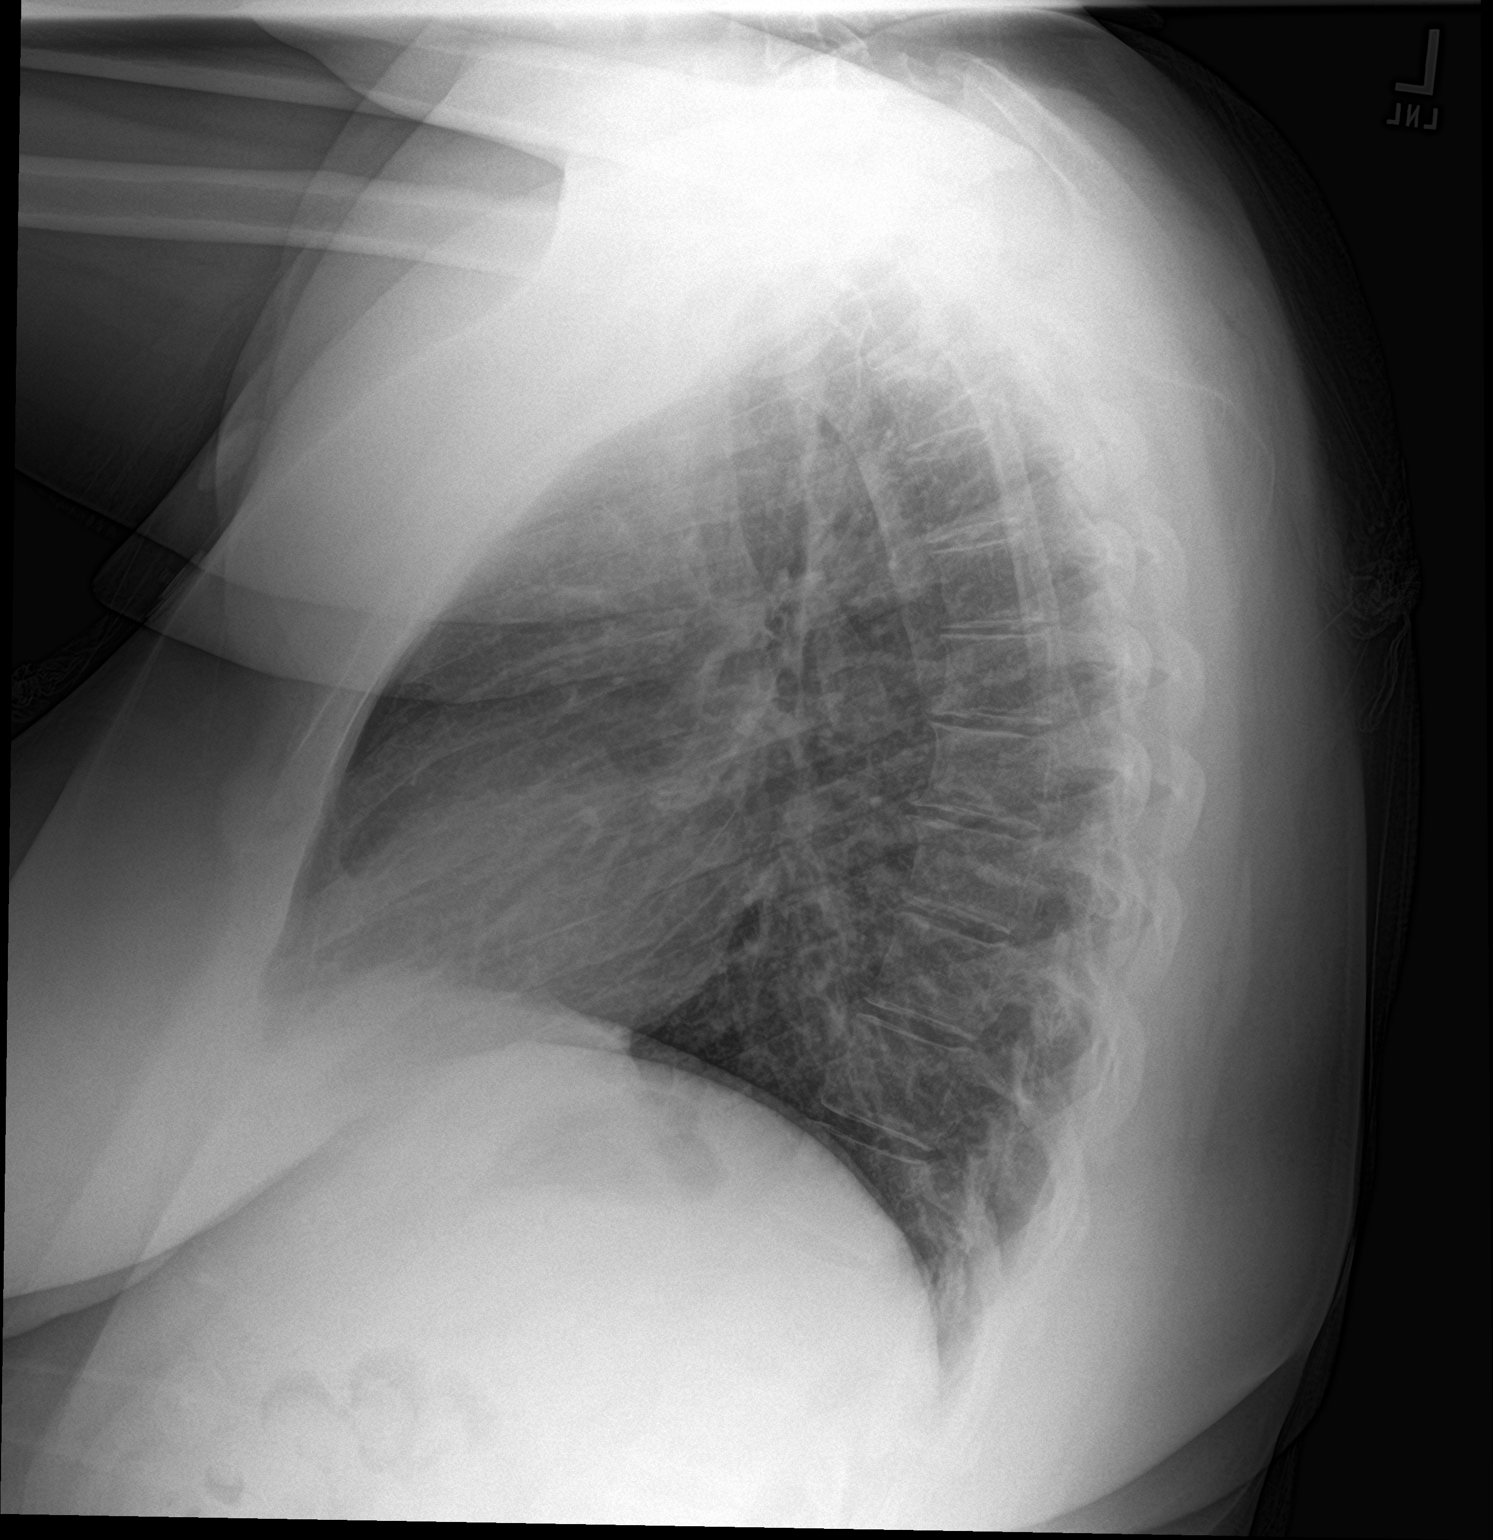

[2 of 2 positions shown; findings below may reference images not displayed]

FINDINGS: Lungs are adequately inflated without focal consolidation or
effusion. There is minimal prominence of the central bronchovascular
markings. Cardiomediastinal silhouette and remainder the exam is
unchanged.
IMPRESSION: Minimal prominence of the central bronchovascular markings which may
be due to an acute bronchitic process.

## 2019-08-25 IMAGING — US US OB COMP LESS 14 WK
1 series · 13 of 28 positions shown · non-contrast
Comparison: None.

CLINICAL DATA: Initial evaluation for acute pelvic pain and early
pregnancy

EXAM:
OBSTETRIC <14 WK US AND TRANSVAGINAL OB US
TECHNIQUE: Both transabdominal and transvaginal ultrasound examinations were
performed for complete evaluation of the gestation as well as the
maternal uterus, adnexal regions, and pelvic cul-de-sac.
Transvaginal technique was performed to assess early pregnancy.

[Series 1: us ob comp less 14 wk · 0.31mm/px · 13 of 44 slices shown]
[im 2/44]
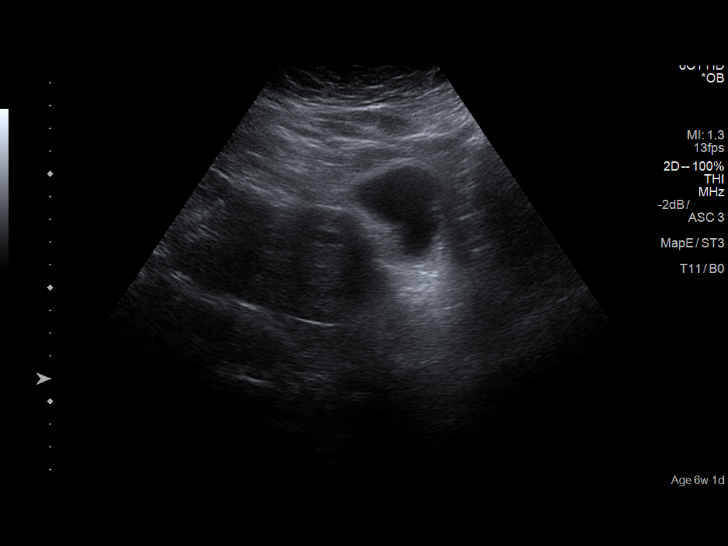
[im 5/44]
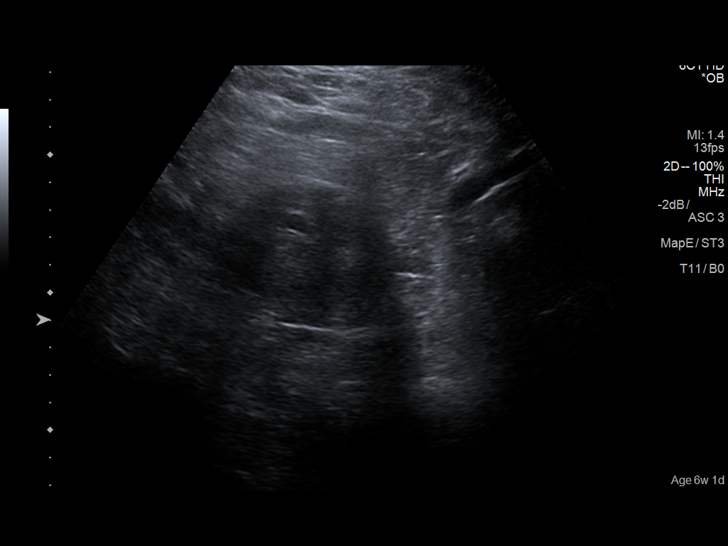
[im 8/44]
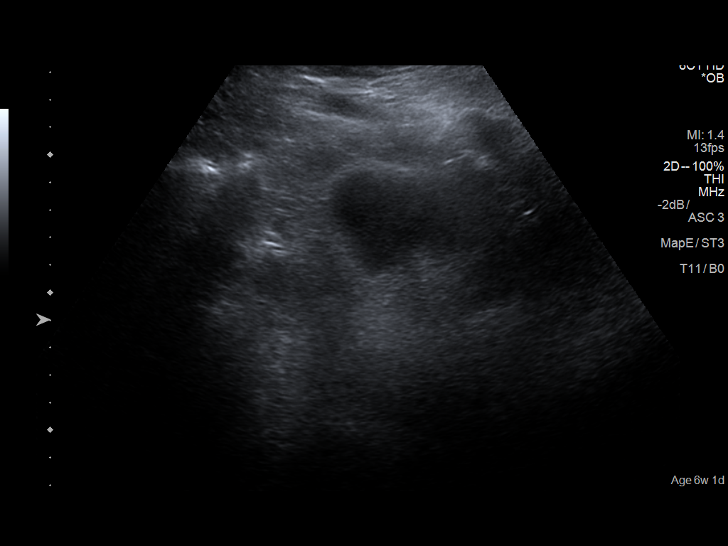
[im 12/44]
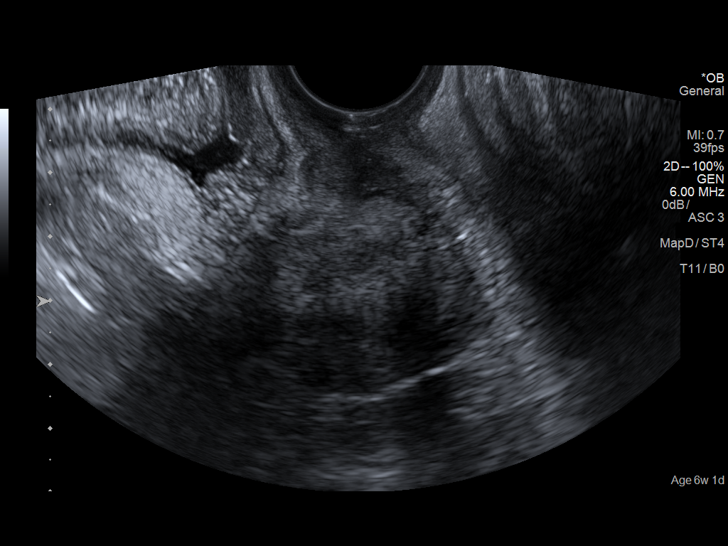
[im 15/44]
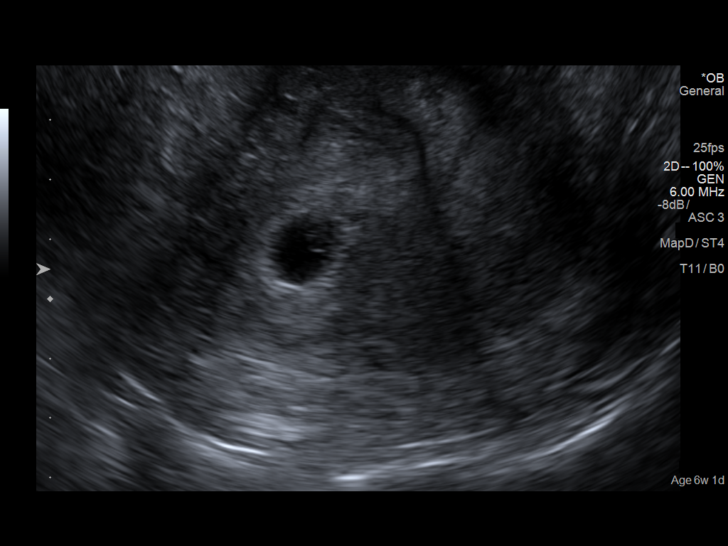
[im 18/44]
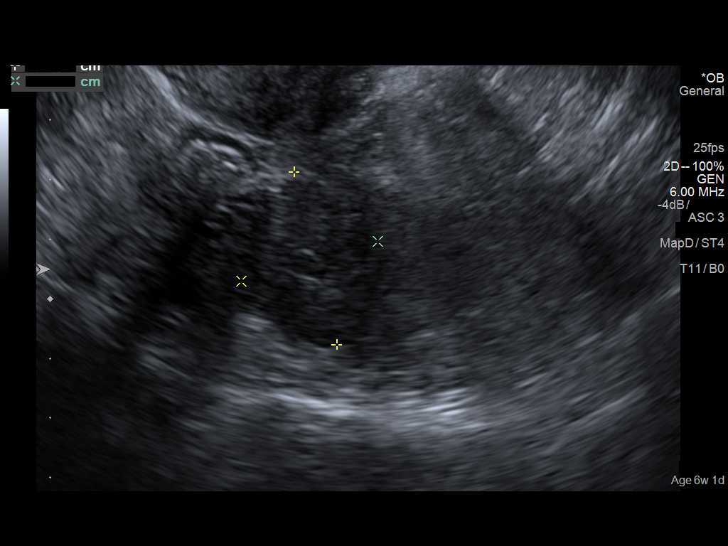
[im 23/44]
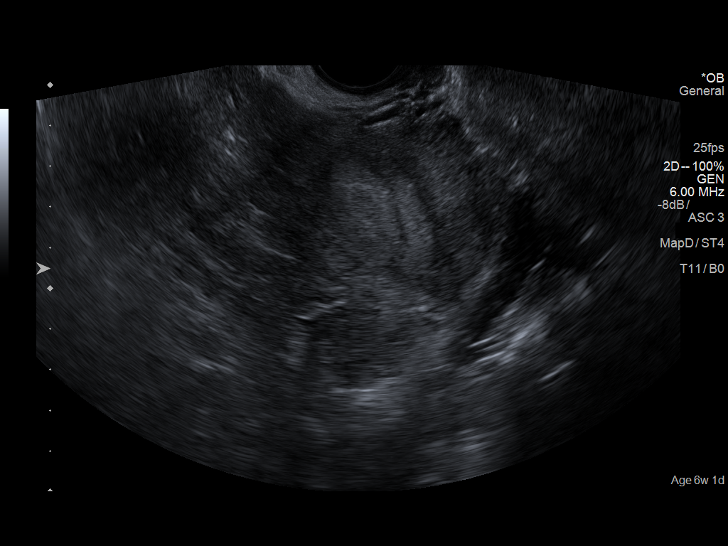
[im 26/44]
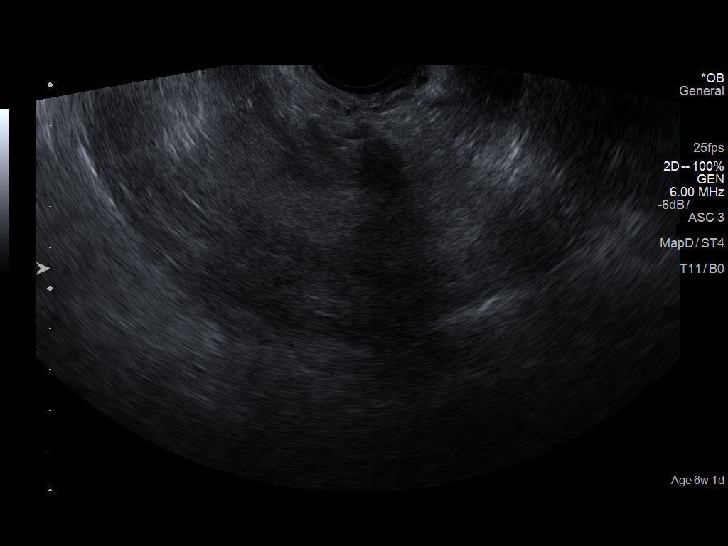
[im 29/44]
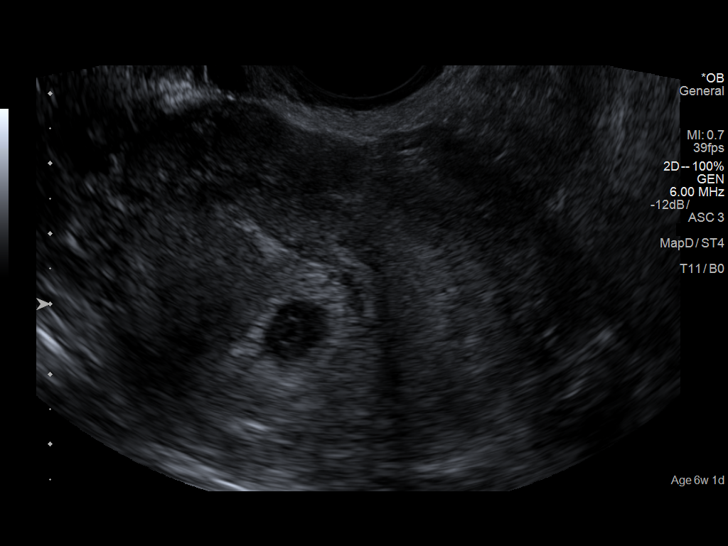
[im 32/44]
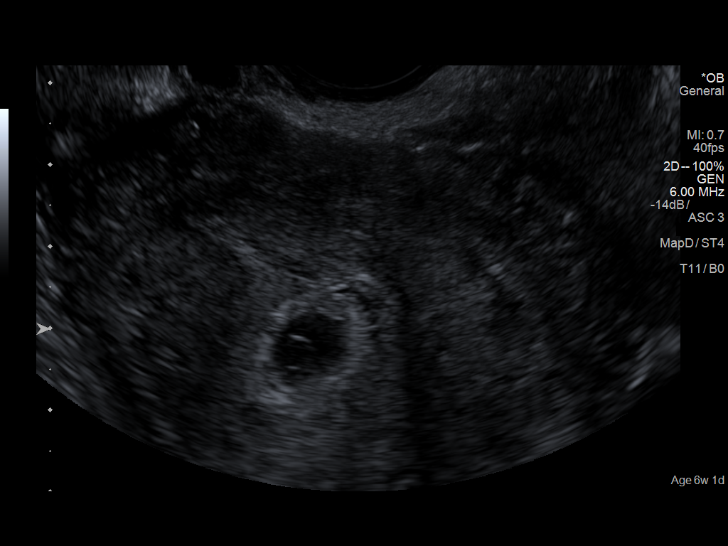
[im 36/44]
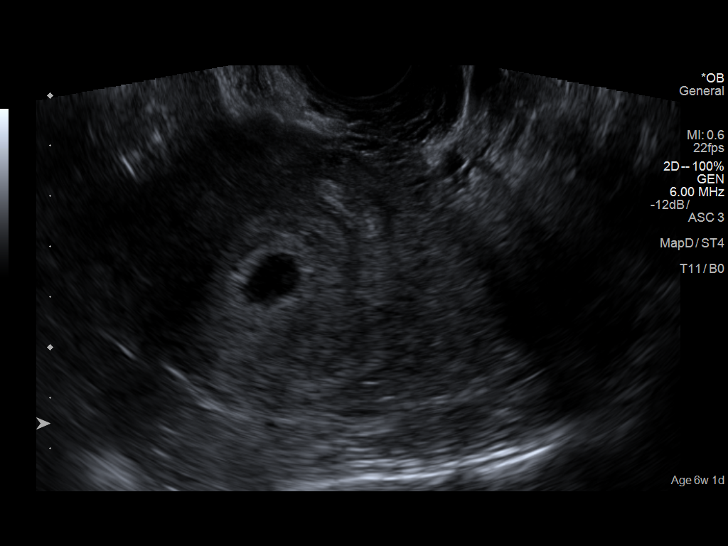
[im 39/44]
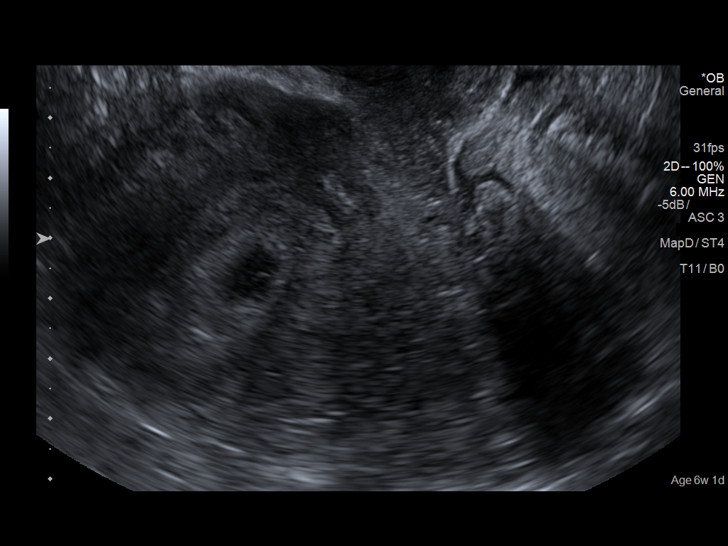
[im 42/44]
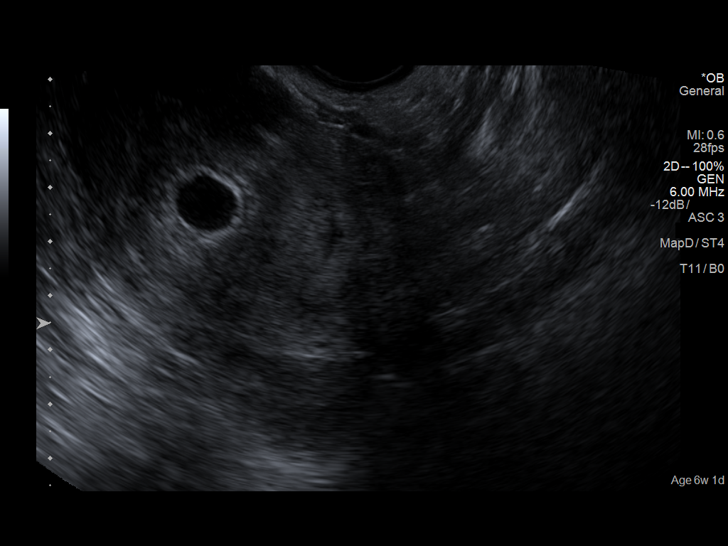

[13 of 28 positions shown; findings below may reference images not displayed]

FINDINGS: Intrauterine gestational sac: Single

Yolk sac:  Present

Embryo:  Not visualized.

Cardiac Activity: N/A

Heart Rate: N/A  bpm

MSD: 11.2  mm   5 w   6  d

Subchorionic hemorrhage:  None visualized.

Maternal uterus/adnexae: Ovaries normal in appearance bilaterally.
No adnexal mass. Trace free fluid within the pelvis.
IMPRESSION: 1. Single intrauterine gestational sac with internal yolk sac, but
no embryo yet visualized. Estimated gestational age 5 weeks and 6
days by mean sac diameter. Recommend follow-up quantitative B-HCG
levels and follow-up US in 14 days to assess viability. This
recommendation follows SRU consensus guidelines: Diagnostic Criteria
for Nonviable Pregnancy Early in the First Trimester. N Engl J Med
5526; [DATE].
2. No other acute maternal uterine or adnexal abnormality
identified.
